# Patient Record
Sex: Male | Born: 1970 | Hispanic: No | Marital: Single | Smoking: Never smoker
Health system: Southern US, Community
[De-identification: ages and names within clinical notes are randomized; demographics above are authoritative.]

---

## 1999-12-06 ENCOUNTER — Emergency Department (HOSPITAL_COMMUNITY): Admission: EM | Admit: 1999-12-06 | Discharge: 1999-12-06 | Payer: Self-pay | Admitting: Emergency Medicine

## 1999-12-06 ENCOUNTER — Encounter: Payer: Self-pay | Admitting: Emergency Medicine

## 1999-12-07 ENCOUNTER — Emergency Department (HOSPITAL_COMMUNITY): Admission: EM | Admit: 1999-12-07 | Discharge: 1999-12-07 | Payer: Self-pay | Admitting: Emergency Medicine

## 2004-08-30 ENCOUNTER — Encounter: Admission: RE | Admit: 2004-08-30 | Discharge: 2004-08-30 | Payer: Self-pay | Admitting: Specialist

## 2006-04-25 IMAGING — CR DG CHEST 1V
1 series · 1 of 1 positions shown · non-contrast
Comparison: None.

CLINICAL DATA: Positive PPD.
 CHEST - ONE VIEW:

[view not recorded]
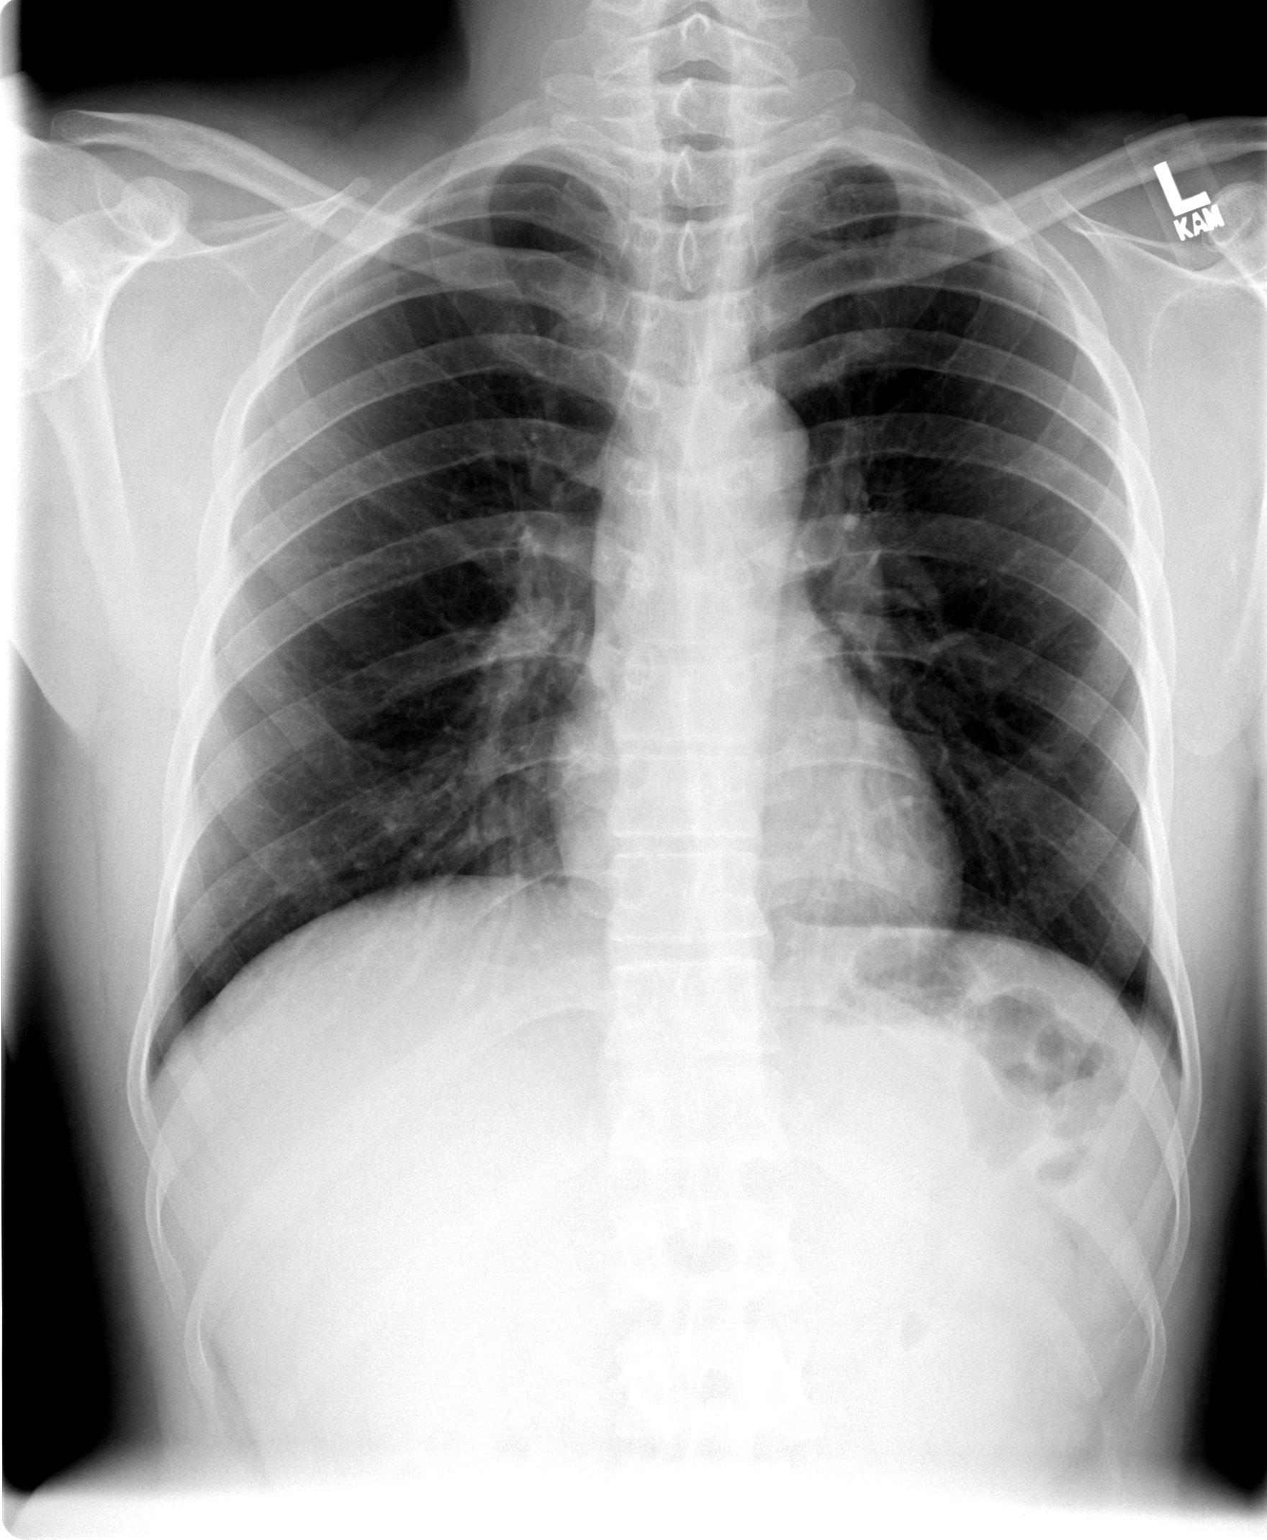

[1 of 1 positions shown; findings below may reference images not displayed]

FINDINGS: Heart and mediastinum normal.  Lungs clear.  Specifically no evidence for acute or chronic tuberculosis in one view.
IMPRESSION: No active cardiopulmonary disease.

## 2019-07-15 ENCOUNTER — Ambulatory Visit (HOSPITAL_COMMUNITY): Admission: EM | Admit: 2019-07-15 | Discharge: 2019-07-15 | Discharge status: Against Medical Advice | Payer: Self-pay

## 2019-07-15 ENCOUNTER — Other Ambulatory Visit: Payer: Self-pay

## 2019-07-15 NOTE — ED Notes (Signed)
Pt told registration he wanted to come back on another day for covid testing.

## 2019-07-20 ENCOUNTER — Other Ambulatory Visit: Payer: Self-pay

## 2019-07-20 ENCOUNTER — Ambulatory Visit (HOSPITAL_COMMUNITY)
Admission: EM | Admit: 2019-07-20 | Discharge: 2019-07-20 | Disposition: A | Discharge status: Home / Self Care | Payer: HRSA Program | Attending: Family Medicine | Admitting: Family Medicine

## 2019-07-20 ENCOUNTER — Encounter (HOSPITAL_COMMUNITY): Payer: Self-pay | Admitting: *Deleted

## 2019-07-20 DIAGNOSIS — Z20828 Contact with and (suspected) exposure to other viral communicable diseases: Secondary | ICD-10-CM

## 2019-07-20 DIAGNOSIS — Z0189 Encounter for other specified special examinations: Secondary | ICD-10-CM

## 2019-07-20 NOTE — Discharge Instructions (Addendum)
If your Covid-19 test is positive, you will receive a phone call from [place] regarding your results. Negative test results are not called. Both positive and negative results area always visible on MyChart. If you do not have a MyChart account, sign up instructions are in your discharge papers.  

## 2019-07-20 NOTE — ED Provider Notes (Signed)
  [place]   [id number] 07/20/19 Arrival Time: 1030  ASSESSMENT & PLAN:  1. Patient request for diagnostic testing     COVID-19 testing sent for travel this upcoming Tuesday.  Follow-up Information    [place].   Specialty: Urgent Care Why: As needed. Contact information: [address] [place] [phone]          Reviewed expectations re: course of current medical issues. Questions answered. Outlined signs and symptoms indicating need for more acute intervention. Patient verbalized understanding. After Visit Summary given.   SUBJECTIVE: History from: patient. Stanley Mcintosh is a 48 y.o. male who requests COVID-19 testing. Known COVID-19 contact: none. Recent travel: none but needs testing to travel to [place] this week.. Denies: runny nose, congestion, fever, cough, sore throat, difficulty breathing, headache.  ROS: As per HPI.   OBJECTIVE:  Vitals:   07/20/19 1053  BP: 123/83  Pulse: 92  Resp: 16  Temp: 98.3 F (36.8 C)  TempSrc: Oral  SpO2: 98%    General appearance: alert; no distress Eyes: PERRLA; EOMI; conjunctiva normal HENT: [place]; AT; nasal mucosa normal; oral mucosa normal Neck: supple  Lungs: unlabored Heart: regular rate and rhythm Abdomen: soft, non-tender Extremities: no edema Skin: warm and dry Neurologic: normal gait Psychological: alert and cooperative; normal mood and affect  Labs: No results found for this or any previous visit. Labs Reviewed  NOVEL CORONAVIRUS, NAA (HOSP ORDER, SEND-OUT TO REF LAB; TAT 18-24 HRS)     No Known Allergies   Social History   Socioeconomic History  . Marital status: Single    Spouse name: Not on file  . Number of children: Not on file  . Years of education: Not on file  . Highest education level: Not on file  Occupational History  . Not on file  Social Needs  . Financial resource strain: Not on file  . Food  insecurity    Worry: Not on file    Inability: Not on file  . Transportation needs    Medical: Not on file    Non-medical: Not on file  Tobacco Use  . Smoking status: Never Smoker  . Smokeless tobacco: Never Used  Substance and Sexual Activity  . Alcohol use: Never    Frequency: Never  . Drug use: Never  . Sexual activity: Not on file  Lifestyle  . Physical activity    Days per week: Not on file    Minutes per session: Not on file  . Stress: Not on file  Relationships  . Social Herbalist on phone: Not on file    Gets together: Not on file    Attends religious service: Not on file    Active member of club or organization: Not on file    Attends meetings of clubs or organizations: Not on file    Relationship status: Not on file  . Intimate partner violence    Fear of current or ex partner: Not on file    Emotionally abused: Not on file    Physically abused: Not on file    Forced sexual activity: Not on file  Other Topics Concern  . Not on file  Social History Narrative  . Not on file   History reviewed. No pertinent family history. History reviewed. No pertinent surgical history.   Vanessa Kick, MD 07/20/19 1118

## 2019-07-20 NOTE — ED Triage Notes (Signed)
Pt denies any c/o's.  States has to travel to [place] in few days and is required to have Covid test.  Denies any exposures.

## 2019-07-22 LAB — NOVEL CORONAVIRUS, NAA (HOSP ORDER, SEND-OUT TO REF LAB; TAT 18-24 HRS): SARS-CoV-2, NAA: NOT DETECTED

## 2020-09-01 ENCOUNTER — Encounter (HOSPITAL_COMMUNITY): Payer: Self-pay

## 2020-09-01 ENCOUNTER — Ambulatory Visit (HOSPITAL_COMMUNITY)
Admission: RE | Admit: 2020-09-01 | Discharge: 2020-09-01 | Disposition: A | Discharge status: Home / Self Care | Payer: HRSA Program | Source: Ambulatory Visit | Attending: Internal Medicine | Admitting: Internal Medicine

## 2020-09-01 ENCOUNTER — Other Ambulatory Visit: Payer: Self-pay

## 2020-09-01 VITALS — BP 123/94 | HR 102 | Temp 98.6°F | Resp 16

## 2020-09-01 DIAGNOSIS — Z1152 Encounter for screening for COVID-19: Secondary | ICD-10-CM

## 2020-09-01 DIAGNOSIS — Z20822 Contact with and (suspected) exposure to covid-19: Secondary | ICD-10-CM | POA: Diagnosis not present

## 2020-09-01 LAB — SARS CORONAVIRUS 2 (TAT 6-24 HRS): SARS Coronavirus 2: NEGATIVE

## 2020-09-01 NOTE — Discharge Instructions (Signed)

## 2020-09-01 NOTE — ED Triage Notes (Signed)
Pt presents for COVID test for Travel. Denies any symptoms at this time.
